# Patient Record
Sex: Female | Born: 1940 | Race: White | Hispanic: No | Marital: Married
Health system: Southern US, Community
[De-identification: ages and names within clinical notes are randomized; demographics above are authoritative.]

---

## 1999-01-24 ENCOUNTER — Other Ambulatory Visit: Admission: RE | Admit: 1999-01-24 | Discharge: 1999-01-24 | Payer: Self-pay | Admitting: Endocrinology

## 1999-10-13 ENCOUNTER — Ambulatory Visit (HOSPITAL_BASED_OUTPATIENT_CLINIC_OR_DEPARTMENT_OTHER): Admission: RE | Admit: 1999-10-13 | Discharge: 1999-10-13 | Payer: Self-pay | Admitting: Orthopedic Surgery

## 2000-06-11 ENCOUNTER — Other Ambulatory Visit: Admission: RE | Admit: 2000-06-11 | Discharge: 2000-06-11 | Payer: Self-pay | Admitting: Internal Medicine

## 2001-06-27 ENCOUNTER — Other Ambulatory Visit: Admission: RE | Admit: 2001-06-27 | Discharge: 2001-06-27 | Payer: Self-pay | Admitting: Endocrinology

## 2002-07-10 ENCOUNTER — Ambulatory Visit (HOSPITAL_COMMUNITY): Admission: RE | Admit: 2002-07-10 | Discharge: 2002-07-10 | Payer: Self-pay | Admitting: *Deleted

## 2002-07-10 ENCOUNTER — Encounter (INDEPENDENT_AMBULATORY_CARE_PROVIDER_SITE_OTHER): Payer: Self-pay | Admitting: *Deleted

## 2002-09-04 ENCOUNTER — Other Ambulatory Visit: Admission: RE | Admit: 2002-09-04 | Discharge: 2002-09-04 | Payer: Self-pay | Admitting: Endocrinology

## 2004-05-24 ENCOUNTER — Encounter: Admission: RE | Admit: 2004-05-24 | Discharge: 2004-05-24 | Payer: Self-pay | Admitting: Endocrinology

## 2004-10-24 ENCOUNTER — Other Ambulatory Visit: Admission: RE | Admit: 2004-10-24 | Discharge: 2004-10-24 | Payer: Self-pay | Admitting: Endocrinology

## 2005-11-28 ENCOUNTER — Other Ambulatory Visit: Admission: RE | Admit: 2005-11-28 | Discharge: 2005-11-28 | Payer: Self-pay | Admitting: Internal Medicine

## 2005-12-05 ENCOUNTER — Encounter: Admission: RE | Admit: 2005-12-05 | Discharge: 2005-12-05 | Payer: Self-pay | Admitting: Endocrinology

## 2006-12-11 ENCOUNTER — Other Ambulatory Visit: Admission: RE | Admit: 2006-12-11 | Discharge: 2006-12-11 | Payer: Self-pay | Admitting: Internal Medicine

## 2006-12-20 ENCOUNTER — Encounter: Admission: RE | Admit: 2006-12-20 | Discharge: 2006-12-20 | Payer: Self-pay | Admitting: Endocrinology

## 2007-01-15 ENCOUNTER — Ambulatory Visit: Payer: Self-pay | Admitting: Thoracic Surgery

## 2007-01-22 ENCOUNTER — Ambulatory Visit (HOSPITAL_COMMUNITY): Admission: RE | Admit: 2007-01-22 | Discharge: 2007-01-22 | Payer: Self-pay | Admitting: Thoracic Surgery

## 2007-01-23 ENCOUNTER — Ambulatory Visit: Payer: Self-pay | Admitting: Thoracic Surgery

## 2007-01-29 ENCOUNTER — Ambulatory Visit: Admission: RE | Admit: 2007-01-29 | Discharge: 2007-04-29 | Payer: Self-pay | Admitting: Radiation Oncology

## 2007-02-12 ENCOUNTER — Ambulatory Visit: Payer: Self-pay | Admitting: Thoracic Surgery

## 2007-02-12 ENCOUNTER — Inpatient Hospital Stay (HOSPITAL_COMMUNITY): Admission: RE | Admit: 2007-02-12 | Discharge: 2007-02-16 | Payer: Self-pay | Admitting: Thoracic Surgery

## 2007-02-12 ENCOUNTER — Encounter (INDEPENDENT_AMBULATORY_CARE_PROVIDER_SITE_OTHER): Payer: Self-pay | Admitting: Specialist

## 2007-02-15 ENCOUNTER — Ambulatory Visit: Payer: Self-pay | Admitting: Internal Medicine

## 2007-02-20 ENCOUNTER — Encounter: Admission: RE | Admit: 2007-02-20 | Discharge: 2007-02-20 | Payer: Self-pay | Admitting: Thoracic Surgery

## 2007-02-20 ENCOUNTER — Ambulatory Visit: Payer: Self-pay | Admitting: Thoracic Surgery

## 2007-03-13 ENCOUNTER — Encounter: Admission: RE | Admit: 2007-03-13 | Discharge: 2007-03-13 | Payer: Self-pay | Admitting: Thoracic Surgery

## 2007-03-13 ENCOUNTER — Ambulatory Visit: Payer: Self-pay | Admitting: Thoracic Surgery

## 2007-03-18 LAB — CBC WITH DIFFERENTIAL/PLATELET
Basophils Absolute: 0.1 10*3/uL (ref 0.0–0.1)
Eosinophils Absolute: 0 10*3/uL (ref 0.0–0.5)
HCT: 40.5 % (ref 34.8–46.6)
HGB: 14.2 g/dL (ref 11.6–15.9)
MONO#: 0.2 10*3/uL (ref 0.1–0.9)
NEUT%: 85.2 % — ABNORMAL HIGH (ref 39.6–76.8)
WBC: 9.3 10*3/uL (ref 3.9–10.0)
lymph#: 1.1 10*3/uL (ref 0.9–3.3)

## 2007-03-18 LAB — COMPREHENSIVE METABOLIC PANEL
ALT: 18 U/L (ref 0–35)
BUN: 11 mg/dL (ref 6–23)
CO2: 24 mEq/L (ref 19–32)
Calcium: 9.5 mg/dL (ref 8.4–10.5)
Chloride: 104 mEq/L (ref 96–112)
Creatinine, Ser: 0.88 mg/dL (ref 0.40–1.20)

## 2007-04-03 ENCOUNTER — Ambulatory Visit: Payer: Self-pay | Admitting: Thoracic Surgery

## 2007-04-03 ENCOUNTER — Encounter: Admission: RE | Admit: 2007-04-03 | Discharge: 2007-04-03 | Payer: Self-pay | Admitting: Thoracic Surgery

## 2007-05-30 ENCOUNTER — Ambulatory Visit (HOSPITAL_COMMUNITY): Admission: RE | Admit: 2007-05-30 | Discharge: 2007-05-30 | Payer: Self-pay | Admitting: Orthopedic Surgery

## 2007-08-05 ENCOUNTER — Ambulatory Visit (HOSPITAL_COMMUNITY): Admission: RE | Admit: 2007-08-05 | Discharge: 2007-08-05 | Payer: Self-pay | Admitting: *Deleted

## 2007-09-06 ENCOUNTER — Ambulatory Visit: Payer: Self-pay | Admitting: Internal Medicine

## 2007-09-10 LAB — COMPREHENSIVE METABOLIC PANEL
Albumin: 3.6 g/dL (ref 3.5–5.2)
Alkaline Phosphatase: 82 U/L (ref 39–117)
CO2: 28 mEq/L (ref 19–32)
Chloride: 104 mEq/L (ref 96–112)
Glucose, Bld: 110 mg/dL — ABNORMAL HIGH (ref 70–99)
Potassium: 4.1 mEq/L (ref 3.5–5.3)
Sodium: 140 mEq/L (ref 135–145)
Total Protein: 6.4 g/dL (ref 6.0–8.3)

## 2007-09-10 LAB — CBC WITH DIFFERENTIAL/PLATELET
BASO%: 0.5 % (ref 0.0–2.0)
HCT: 40.1 % (ref 34.8–46.6)
HGB: 14 g/dL (ref 11.6–15.9)
MCHC: 34.8 g/dL (ref 32.0–36.0)
MONO#: 0.5 10*3/uL (ref 0.1–0.9)
NEUT%: 63 % (ref 39.6–76.8)
WBC: 7 10*3/uL (ref 3.9–10.0)
lymph#: 1.8 10*3/uL (ref 0.9–3.3)

## 2007-09-12 ENCOUNTER — Ambulatory Visit (HOSPITAL_COMMUNITY): Admission: RE | Admit: 2007-09-12 | Discharge: 2007-09-12 | Payer: Self-pay | Admitting: Internal Medicine

## 2007-09-20 ENCOUNTER — Encounter: Admission: RE | Admit: 2007-09-20 | Discharge: 2007-09-20 | Payer: Self-pay | Admitting: Urology

## 2007-11-01 ENCOUNTER — Ambulatory Visit: Admission: RE | Admit: 2007-11-01 | Discharge: 2007-11-26 | Payer: Self-pay | Admitting: Radiation Oncology

## 2008-01-30 ENCOUNTER — Other Ambulatory Visit: Admission: RE | Admit: 2008-01-30 | Discharge: 2008-01-30 | Payer: Self-pay | Admitting: Internal Medicine

## 2008-03-11 ENCOUNTER — Ambulatory Visit (HOSPITAL_COMMUNITY): Admission: RE | Admit: 2008-03-11 | Discharge: 2008-03-11 | Payer: Self-pay | Admitting: Internal Medicine

## 2008-03-11 ENCOUNTER — Ambulatory Visit: Payer: Self-pay | Admitting: Internal Medicine

## 2008-03-11 LAB — CBC WITH DIFFERENTIAL/PLATELET
BASO%: 0.4 % (ref 0.0–2.0)
Eosinophils Absolute: 0.1 10*3/uL (ref 0.0–0.5)
MCHC: 34.4 g/dL (ref 32.0–36.0)
MCV: 89.8 fL (ref 81.0–101.0)
MONO%: 8.5 % (ref 0.0–13.0)
NEUT#: 3.9 10*3/uL (ref 1.5–6.5)
RBC: 4.65 10*6/uL (ref 3.70–5.32)
RDW: 14.2 % (ref 11.3–14.5)
WBC: 6.1 10*3/uL (ref 3.9–10.0)

## 2008-03-11 LAB — COMPREHENSIVE METABOLIC PANEL
ALT: 20 U/L (ref 0–35)
AST: 22 U/L (ref 0–37)
Albumin: 3.8 g/dL (ref 3.5–5.2)
Alkaline Phosphatase: 77 U/L (ref 39–117)
Calcium: 9.1 mg/dL (ref 8.4–10.5)
Glucose, Bld: 89 mg/dL (ref 70–99)
Potassium: 4 mEq/L (ref 3.5–5.3)
Sodium: 139 mEq/L (ref 135–145)
Total Bilirubin: 0.6 mg/dL (ref 0.3–1.2)
Total Protein: 6.7 g/dL (ref 6.0–8.3)

## 2008-09-04 ENCOUNTER — Ambulatory Visit: Payer: Self-pay | Admitting: Internal Medicine

## 2008-09-08 ENCOUNTER — Ambulatory Visit (HOSPITAL_COMMUNITY): Admission: RE | Admit: 2008-09-08 | Discharge: 2008-09-08 | Payer: Self-pay | Admitting: Internal Medicine

## 2008-09-08 LAB — COMPREHENSIVE METABOLIC PANEL
ALT: 19 U/L (ref 0–35)
AST: 21 U/L (ref 0–37)
BUN: 15 mg/dL (ref 6–23)
CO2: 29 mEq/L (ref 19–32)
Creatinine, Ser: 1 mg/dL (ref 0.40–1.20)
Total Bilirubin: 0.5 mg/dL (ref 0.3–1.2)

## 2008-09-08 LAB — CBC WITH DIFFERENTIAL/PLATELET
BASO%: 0.4 % (ref 0.0–2.0)
EOS%: 1.7 % (ref 0.0–7.0)
HCT: 44 % (ref 34.8–46.6)
LYMPH%: 25.8 % (ref 14.0–48.0)
MCH: 31.4 pg (ref 26.0–34.0)
MCHC: 34.3 g/dL (ref 32.0–36.0)
NEUT%: 64 % (ref 39.6–76.8)
Platelets: 311 10*3/uL (ref 145–400)

## 2008-09-17 ENCOUNTER — Ambulatory Visit (HOSPITAL_COMMUNITY): Admission: RE | Admit: 2008-09-17 | Discharge: 2008-09-17 | Payer: Self-pay | Admitting: Internal Medicine

## 2008-09-19 IMAGING — PT NM PET TUM IMG SKULL BASE T - THIGH
7 series · 25 of 25 positions shown · non-contrast
Comparison: CT chest from 12/20/2006

FDG PET-CT TUMOR IMAGING (SKULL BASE TO THIGHS):

Fasting Blood Glucose:  118

CLINICAL DATA: Left upper lobe pulmonary nodule.
TECHNIQUE: 16.4 mCi F-18 FDG was injected intravenously via the right hand . 
Full-ring PET imaging was performed from the skull base through the mid-thighs
54 minutes after injection.  CT data was obtained and used for attenuation
correction and anatomic localization only.  (This was not acquired as a
diagnostic CT examination.)

[Series 1: pet ac · axial · 3.3mm · 4.69mm/px · z∈[-870,+0]mm · 5 of 267 slices shown]
[im 1/267]
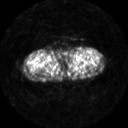
[im 67/267]
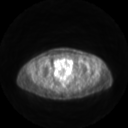
[im 134/267]
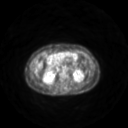
[im 200/267]
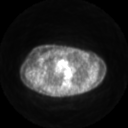
[im 267/267]
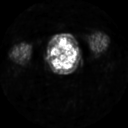

[Series 2: ct images · axial · 3.8mm · 0.98mm/px · z∈[-870,+0]mm · 5 of 265 slices shown]
[im 1/265]
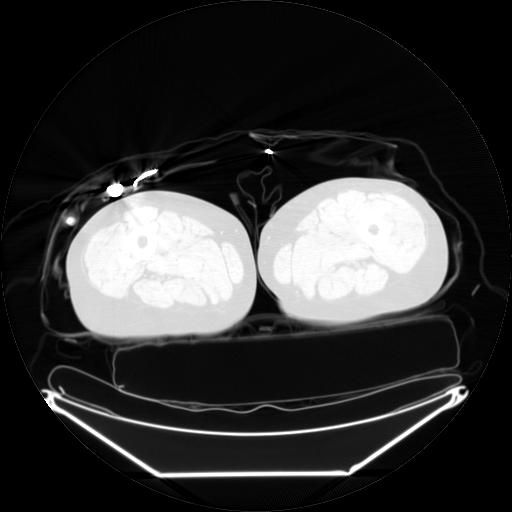
[im 67/265]
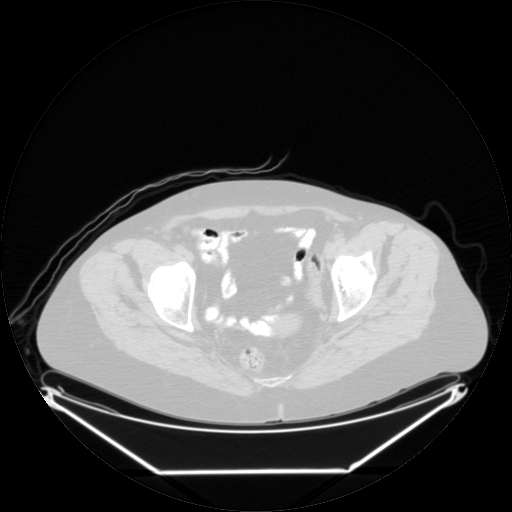
[im 133/265]
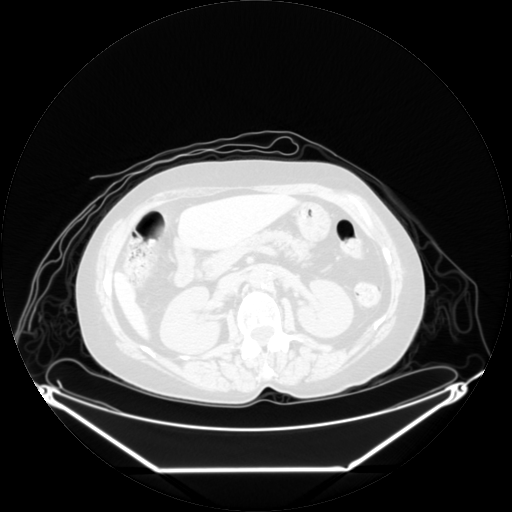
[im 199/265]
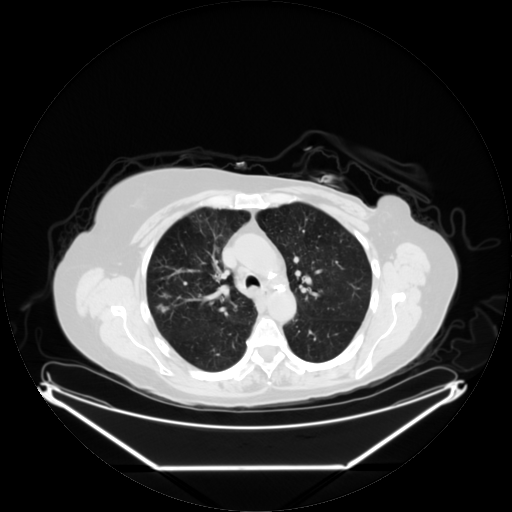
[im 265/265  brain]
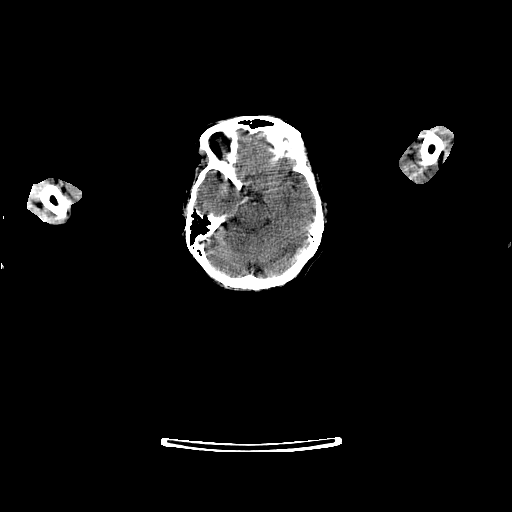

[Series 2: pet nac · axial · 3.3mm · 4.69mm/px · z∈[-870,+0]mm · 6 of 267 slices shown]
[im 1/267]
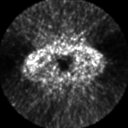
[im 54/267]
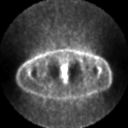
[im 107/267]
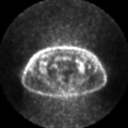
[im 160/267]
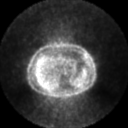
[im 213/267]
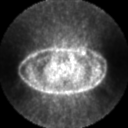
[im 267/267]
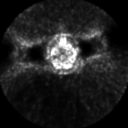

[Series 123: mip · coronal · 3.3mm · 4.69mm/px · 1 of 30 slices shown]
[im 1/30]
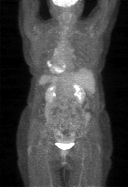

[Series 151: reformatted · axial · 3.3mm · 3.91mm/px · z∈[-870,+0]mm · 6 of 265 slices shown (1 of 3)]
[im 1/265]
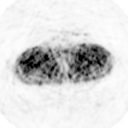
[im 53/265]
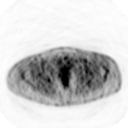
[im 106/265]
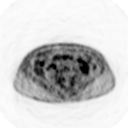
[im 159/265]
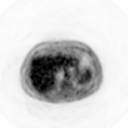
[im 212/265]
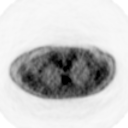
[im 265/265]
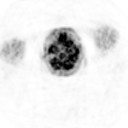

[Series 151: reformatted · axial · 3.3mm · 1.17mm/px · 1 of 3 slices shown (2 of 3)]
[im 1/3]
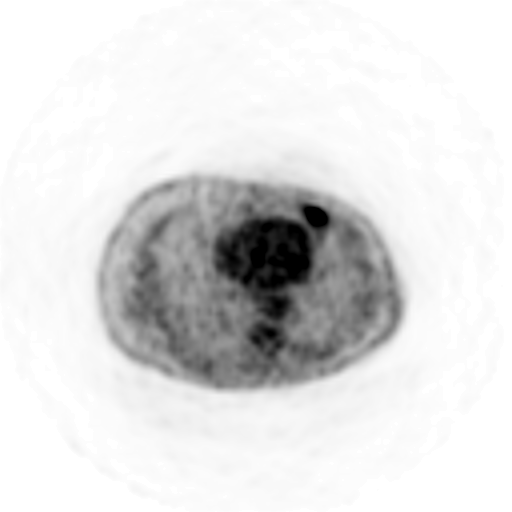

[Series 153: reformatted · coronal · 4.7mm · 6.98mm/px · 1 of 70 slices shown (3 of 3)]
[im 1/70]
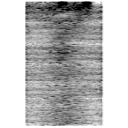

[25 of 25 positions shown; findings below may reference images not displayed]

FINDINGS: Three areas of abnormal FDG accumulation are identified in the chest. One of
these areas corresponds to the 11 mm left upper lobe pulmonary nodule. SUV max
for this lesion is 2.4. There is an intense focus of FDG uptake in the
anteromedial lingula, corresponding to a bilobed focus of airspace
consolidation. This is new in the one month interval since the previous chest CT
and although it is quite FDG avid, the rapid interval appearance suggests this
is most likely related to an infectious process. The third area of FDG uptake is
in the posterior right upper lobe in an area of alveolar opacity, almost having
a tree-in-bud appearance. See image 69 of the CT data. This is relatively low
level accumulation and is compatible with an infectious or inflammatory process.

No abnormal FDG accumulation is identified in the neck, abdomen, or pelvis.

The patient is status post left mastectomy. The left thyroid gland appears
absent. There is an exophytic nodule from the posterior right thyroid gland
which is not hypermetabolic on the PET scan.
IMPRESSION: The 11 mm left upper lobe pulmonary nodule is FDG avid. While the uptake is
borderline between infectious/inflammatory and neoplastic etiology, given the
small size of the nodule, the uptake is concerning for neoplastic involvement.

Probable lingular pneumonia given the rapid interval appearance. Repeat CT chest
after therapy could be used to ensure resolution.

A cluster of patchy alveolar opacities seen in the right upper lobe, also
probably postinfectious or inflammatory, given the rapid interval appearance
since the prior CT chest.

## 2008-09-29 ENCOUNTER — Encounter: Admission: RE | Admit: 2008-09-29 | Discharge: 2008-09-29 | Payer: Self-pay | Admitting: Endocrinology

## 2009-03-05 ENCOUNTER — Ambulatory Visit: Payer: Self-pay | Admitting: Internal Medicine

## 2009-03-09 LAB — CBC WITH DIFFERENTIAL/PLATELET
BASO%: 0.4 % (ref 0.0–2.0)
EOS%: 1.6 % (ref 0.0–7.0)
HCT: 40.3 % (ref 34.8–46.6)
LYMPH%: 24.6 % (ref 14.0–49.7)
MCH: 31.3 pg (ref 25.1–34.0)
MCHC: 34.2 g/dL (ref 31.5–36.0)
NEUT%: 65.3 % (ref 38.4–76.8)
lymph#: 1.6 10*3/uL (ref 0.9–3.3)

## 2009-03-09 LAB — COMPREHENSIVE METABOLIC PANEL
ALT: 23 U/L (ref 0–35)
AST: 27 U/L (ref 0–37)
Alkaline Phosphatase: 97 U/L (ref 39–117)
Chloride: 103 mEq/L (ref 96–112)
Creatinine, Ser: 0.8 mg/dL (ref 0.40–1.20)
Total Bilirubin: 0.4 mg/dL (ref 0.3–1.2)

## 2009-03-11 ENCOUNTER — Ambulatory Visit (HOSPITAL_COMMUNITY): Admission: RE | Admit: 2009-03-11 | Discharge: 2009-03-11 | Payer: Self-pay | Admitting: Internal Medicine

## 2009-03-17 ENCOUNTER — Ambulatory Visit: Payer: Self-pay | Admitting: Thoracic Surgery

## 2009-05-12 ENCOUNTER — Other Ambulatory Visit: Admission: RE | Admit: 2009-05-12 | Discharge: 2009-05-12 | Payer: Self-pay | Admitting: Internal Medicine

## 2009-09-08 ENCOUNTER — Ambulatory Visit: Payer: Self-pay | Admitting: Internal Medicine

## 2009-09-10 ENCOUNTER — Ambulatory Visit (HOSPITAL_COMMUNITY): Admission: RE | Admit: 2009-09-10 | Discharge: 2009-09-10 | Payer: Self-pay | Admitting: Internal Medicine

## 2009-09-10 LAB — COMPREHENSIVE METABOLIC PANEL
ALT: 17 U/L (ref 0–35)
AST: 20 U/L (ref 0–37)
Calcium: 9.5 mg/dL (ref 8.4–10.5)
Chloride: 100 mEq/L (ref 96–112)
Creatinine, Ser: 0.98 mg/dL (ref 0.40–1.20)
Potassium: 4.3 mEq/L (ref 3.5–5.3)
Sodium: 137 mEq/L (ref 135–145)

## 2009-09-10 LAB — CBC WITH DIFFERENTIAL/PLATELET
BASO%: 0.3 % (ref 0.0–2.0)
EOS%: 1.7 % (ref 0.0–7.0)
MCH: 31.4 pg (ref 25.1–34.0)
MCHC: 34.3 g/dL (ref 31.5–36.0)
NEUT%: 71.7 % (ref 38.4–76.8)
RBC: 4.74 10*6/uL (ref 3.70–5.45)
RDW: 13.6 % (ref 11.2–14.5)
lymph#: 1.5 10*3/uL (ref 0.9–3.3)

## 2009-09-15 ENCOUNTER — Ambulatory Visit: Payer: Self-pay | Admitting: Thoracic Surgery

## 2009-09-20 ENCOUNTER — Encounter: Payer: Self-pay | Admitting: Internal Medicine

## 2009-09-21 ENCOUNTER — Ambulatory Visit (HOSPITAL_COMMUNITY): Admission: RE | Admit: 2009-09-21 | Discharge: 2009-09-21 | Payer: Self-pay | Admitting: Thoracic Surgery

## 2009-09-21 ENCOUNTER — Ambulatory Visit: Payer: Self-pay | Admitting: Thoracic Surgery

## 2009-09-21 ENCOUNTER — Encounter: Payer: Self-pay | Admitting: Thoracic Surgery

## 2009-10-12 ENCOUNTER — Ambulatory Visit: Payer: Self-pay | Admitting: Internal Medicine

## 2009-12-05 ENCOUNTER — Inpatient Hospital Stay (HOSPITAL_COMMUNITY): Admission: EM | Admit: 2009-12-05 | Discharge: 2009-12-13 | Payer: Self-pay | Admitting: Emergency Medicine

## 2009-12-06 ENCOUNTER — Encounter (INDEPENDENT_AMBULATORY_CARE_PROVIDER_SITE_OTHER): Payer: Self-pay | Admitting: Internal Medicine

## 2009-12-06 ENCOUNTER — Ambulatory Visit: Payer: Self-pay | Admitting: Vascular Surgery

## 2009-12-09 ENCOUNTER — Encounter (INDEPENDENT_AMBULATORY_CARE_PROVIDER_SITE_OTHER): Payer: Self-pay | Admitting: Internal Medicine

## 2009-12-17 ENCOUNTER — Inpatient Hospital Stay (HOSPITAL_COMMUNITY): Admission: EM | Admit: 2009-12-17 | Discharge: 2009-12-19 | Payer: Self-pay | Admitting: Emergency Medicine

## 2009-12-20 ENCOUNTER — Ambulatory Visit: Payer: Self-pay | Admitting: Internal Medicine

## 2009-12-20 LAB — CBC WITH DIFFERENTIAL/PLATELET
Basophils Absolute: 0 10*3/uL (ref 0.0–0.1)
Eosinophils Absolute: 0 10*3/uL (ref 0.0–0.5)
HGB: 13.7 g/dL (ref 11.6–15.9)
MCV: 92.5 fL (ref 79.5–101.0)
MONO#: 0.5 10*3/uL (ref 0.1–0.9)
MONO%: 2.9 % (ref 0.0–14.0)
NEUT#: 15.6 10*3/uL — ABNORMAL HIGH (ref 1.5–6.5)
RDW: 14.6 % — ABNORMAL HIGH (ref 11.2–14.5)
lymph#: 1 10*3/uL (ref 0.9–3.3)

## 2009-12-20 LAB — COMPREHENSIVE METABOLIC PANEL
Albumin: 3.3 g/dL — ABNORMAL LOW (ref 3.5–5.2)
BUN: 30 mg/dL — ABNORMAL HIGH (ref 6–23)
CO2: 32 mEq/L (ref 19–32)
Calcium: 8.3 mg/dL — ABNORMAL LOW (ref 8.4–10.5)
Chloride: 89 mEq/L — ABNORMAL LOW (ref 96–112)
Glucose, Bld: 324 mg/dL — ABNORMAL HIGH (ref 70–99)
Potassium: 3.6 mEq/L (ref 3.5–5.3)

## 2009-12-24 ENCOUNTER — Ambulatory Visit (HOSPITAL_COMMUNITY): Admission: RE | Admit: 2009-12-24 | Discharge: 2009-12-24 | Payer: Self-pay | Admitting: Internal Medicine

## 2009-12-28 LAB — CBC WITH DIFFERENTIAL/PLATELET
BASO%: 0.1 % (ref 0.0–2.0)
HGB: 13.6 g/dL (ref 11.6–15.9)
LYMPH%: 4.4 % — ABNORMAL LOW (ref 14.0–49.7)
MCH: 31.2 pg (ref 25.1–34.0)
MCHC: 34.1 g/dL (ref 31.5–36.0)
MONO#: 0.4 10*3/uL (ref 0.1–0.9)
MONO%: 2.6 % (ref 0.0–14.0)
NEUT#: 13.4 10*3/uL — ABNORMAL HIGH (ref 1.5–6.5)
NEUT%: 92.8 % — ABNORMAL HIGH (ref 38.4–76.8)

## 2009-12-28 LAB — COMPREHENSIVE METABOLIC PANEL
ALT: 32 U/L (ref 0–35)
Alkaline Phosphatase: 102 U/L (ref 39–117)
CO2: 40 mEq/L — ABNORMAL HIGH (ref 19–32)
Creatinine, Ser: 0.86 mg/dL (ref 0.40–1.20)
Total Bilirubin: 0.3 mg/dL (ref 0.3–1.2)

## 2009-12-31 ENCOUNTER — Ambulatory Visit: Admission: RE | Admit: 2009-12-31 | Discharge: 2010-01-12 | Payer: Self-pay | Admitting: Radiation Oncology

## 2010-02-25 DEATH — deceased

## 2010-12-18 ENCOUNTER — Encounter: Payer: Self-pay | Admitting: Internal Medicine

## 2011-02-12 LAB — COMPREHENSIVE METABOLIC PANEL
ALT: 59 U/L — ABNORMAL HIGH (ref 0–35)
AST: 25 U/L (ref 0–37)
AST: 38 U/L — ABNORMAL HIGH (ref 0–37)
Albumin: 3.1 g/dL — ABNORMAL LOW (ref 3.5–5.2)
BUN: 18 mg/dL (ref 6–23)
CO2: 35 mEq/L — ABNORMAL HIGH (ref 19–32)
CO2: 38 mEq/L — ABNORMAL HIGH (ref 19–32)
Calcium: 8.8 mg/dL (ref 8.4–10.5)
Calcium: 8.8 mg/dL (ref 8.4–10.5)
Chloride: 87 mEq/L — ABNORMAL LOW (ref 96–112)
Creatinine, Ser: 0.76 mg/dL (ref 0.4–1.2)
GFR calc Af Amer: >60 mL/min (ref >60)
GFR calc Af Amer: >60 mL/min (ref >60)
GFR calc non Af Amer: 58 mL/min — ABNORMAL LOW (ref >60)
GFR calc non Af Amer: >60 mL/min (ref >60)
Sodium: 139 mEq/L (ref 135–145)
Total Bilirubin: 0.2 mg/dL — ABNORMAL LOW (ref 0.3–1.2)

## 2011-02-12 LAB — BLOOD GAS, ARTERIAL
Drawn by: 229971
O2 Content: 2 L/min
pCO2 arterial: 52.1 mmHg — ABNORMAL HIGH (ref 35.0–45.0)
pH, Arterial: 7.484 — ABNORMAL HIGH (ref 7.350–7.400)

## 2011-02-12 LAB — BASIC METABOLIC PANEL
BUN: 55 mg/dL — ABNORMAL HIGH (ref 6–23)
CO2: 37 mEq/L — ABNORMAL HIGH (ref 19–32)
CO2: 38 mEq/L — ABNORMAL HIGH (ref 19–32)
CO2: 39 mEq/L — ABNORMAL HIGH (ref 19–32)
CO2: 40 mEq/L — ABNORMAL HIGH (ref 19–32)
CO2: 41 mEq/L (ref 19–32)
Calcium: 8.8 mg/dL (ref 8.4–10.5)
Chloride: 90 mEq/L — ABNORMAL LOW (ref 96–112)
Chloride: 91 mEq/L — ABNORMAL LOW (ref 96–112)
Chloride: 95 mEq/L — ABNORMAL LOW (ref 96–112)
Chloride: 73 mEq/L — ABNORMAL LOW (ref 96–112)
Chloride: 85 mEq/L — ABNORMAL LOW (ref 96–112)
Chloride: 90 mEq/L — ABNORMAL LOW (ref 96–112)
Chloride: 91 mEq/L — ABNORMAL LOW (ref 96–112)
Chloride: 92 mEq/L — ABNORMAL LOW (ref 96–112)
Creatinine, Ser: 0.78 mg/dL (ref 0.4–1.2)
Creatinine, Ser: 0.87 mg/dL (ref 0.4–1.2)
Creatinine, Ser: 1 mg/dL (ref 0.4–1.2)
GFR calc Af Amer: >60 mL/min (ref >60)
GFR calc Af Amer: >60 mL/min (ref >60)
GFR calc Af Amer: >60 mL/min (ref >60)
GFR calc Af Amer: >60 mL/min (ref >60)
GFR calc Af Amer: >60 mL/min (ref >60)
GFR calc Af Amer: >60 mL/min (ref >60)
GFR calc non Af Amer: >60 mL/min (ref >60)
GFR calc non Af Amer: >60 mL/min (ref >60)
GFR calc non Af Amer: >60 mL/min (ref >60)
GFR calc non Af Amer: >60 mL/min (ref >60)
Glucose, Bld: 142 mg/dL — ABNORMAL HIGH (ref 70–99)
Glucose, Bld: 170 mg/dL — ABNORMAL HIGH (ref 70–99)
Glucose, Bld: 262 mg/dL — ABNORMAL HIGH (ref 70–99)
Glucose, Bld: 339 mg/dL — ABNORMAL HIGH (ref 70–99)
Potassium: 2.9 mEq/L — ABNORMAL LOW (ref 3.5–5.1)
Potassium: 3.4 mEq/L — ABNORMAL LOW (ref 3.5–5.1)
Potassium: 4.2 mEq/L (ref 3.5–5.1)
Potassium: 4.2 mEq/L (ref 3.5–5.1)
Potassium: 2.8 mEq/L — ABNORMAL LOW (ref 3.5–5.1)
Potassium: 3 mEq/L — ABNORMAL LOW (ref 3.5–5.1)
Potassium: 3.4 mEq/L — ABNORMAL LOW (ref 3.5–5.1)
Potassium: 3.6 mEq/L (ref 3.5–5.1)
Potassium: <2 mEq/L — CL (ref 3.5–5.1)
Sodium: 135 mEq/L (ref 135–145)
Sodium: 138 mEq/L (ref 135–145)
Sodium: 138 mEq/L (ref 135–145)
Sodium: 140 mEq/L (ref 135–145)
Sodium: 138 mEq/L (ref 135–145)
Sodium: 139 mEq/L (ref 135–145)
Sodium: 139 mEq/L (ref 135–145)
Sodium: 140 mEq/L (ref 135–145)

## 2011-02-12 LAB — CBC
HCT: 41.7 % (ref 36.0–46.0)
HCT: 42.4 % (ref 36.0–46.0)
HCT: 42.5 % (ref 36.0–46.0)
HCT: 42.5 % (ref 36.0–46.0)
HCT: 37.4 % (ref 36.0–46.0)
HCT: 46.5 % — ABNORMAL HIGH (ref 36.0–46.0)
HCT: 47.4 % — ABNORMAL HIGH (ref 36.0–46.0)
HCT: 47.6 % — ABNORMAL HIGH (ref 36.0–46.0)
Hemoglobin: 13.7 g/dL (ref 12.0–15.0)
Hemoglobin: 14.1 g/dL (ref 12.0–15.0)
Hemoglobin: 14.2 g/dL (ref 12.0–15.0)
Hemoglobin: 14.3 g/dL (ref 12.0–15.0)
Hemoglobin: 16.5 g/dL — ABNORMAL HIGH (ref 12.0–15.0)
Hemoglobin: 12.8 g/dL (ref 12.0–15.0)
Hemoglobin: 15.6 g/dL — ABNORMAL HIGH (ref 12.0–15.0)
Hemoglobin: 15.6 g/dL — ABNORMAL HIGH (ref 12.0–15.0)
Hemoglobin: 16.3 g/dL — ABNORMAL HIGH (ref 12.0–15.0)
MCHC: 33 g/dL (ref 30.0–36.0)
MCHC: 33.2 g/dL (ref 30.0–36.0)
MCHC: 33.3 g/dL (ref 30.0–36.0)
MCHC: 33.4 g/dL (ref 30.0–36.0)
MCHC: 32.9 g/dL (ref 30.0–36.0)
MCV: 92.4 fL (ref 78.0–100.0)
MCV: 92.6 fL (ref 78.0–100.0)
MCV: 92.8 fL (ref 78.0–100.0)
MCV: 92.9 fL (ref 78.0–100.0)
MCV: 91.1 fL (ref 78.0–100.0)
MCV: 92.5 fL (ref 78.0–100.0)
MCV: 93.1 fL (ref 78.0–100.0)
Platelets: 218 10*3/uL (ref 150–400)
Platelets: 248 10*3/uL (ref 150–400)
RBC: 4.46 MIL/uL (ref 3.87–5.11)
RBC: 4.58 MIL/uL (ref 3.87–5.11)
RBC: 4.59 MIL/uL (ref 3.87–5.11)
RBC: 4.59 MIL/uL (ref 3.87–5.11)
RBC: 4.89 MIL/uL (ref 3.87–5.11)
RBC: 5.36 MIL/uL — ABNORMAL HIGH (ref 3.87–5.11)
RBC: 4.05 MIL/uL (ref 3.87–5.11)
RBC: 5.23 MIL/uL — ABNORMAL HIGH (ref 3.87–5.11)
RDW: 15.7 % — ABNORMAL HIGH (ref 11.5–15.5)
RDW: 14.6 % (ref 11.5–15.5)
WBC: 10.6 10*3/uL — ABNORMAL HIGH (ref 4.0–10.5)
WBC: 11.1 10*3/uL — ABNORMAL HIGH (ref 4.0–10.5)
WBC: 15.5 10*3/uL — ABNORMAL HIGH (ref 4.0–10.5)
WBC: 17.8 10*3/uL — ABNORMAL HIGH (ref 4.0–10.5)
WBC: 14.1 10*3/uL — ABNORMAL HIGH (ref 4.0–10.5)
WBC: 18.3 10*3/uL — ABNORMAL HIGH (ref 4.0–10.5)
WBC: 26.8 10*3/uL — ABNORMAL HIGH (ref 4.0–10.5)

## 2011-02-12 LAB — DIFFERENTIAL
Basophils Absolute: 0 K/uL (ref 0.0–0.1)
Basophils Relative: 0 % (ref 0–1)
Basophils Relative: 1 % (ref 0–1)
Eosinophils Absolute: 0 10*3/uL (ref 0.0–0.7)
Eosinophils Absolute: 0 K/uL (ref 0.0–0.7)
Eosinophils Relative: 0 % (ref 0–5)
Eosinophils Relative: 0 % (ref 0–5)
Eosinophils Relative: 0 % (ref 0–5)
Lymphocytes Relative: 7 % — ABNORMAL LOW (ref 12–46)
Lymphs Abs: 0.6 10*3/uL — ABNORMAL LOW (ref 0.7–4.0)
Lymphs Abs: 1 K/uL (ref 0.7–4.0)
Lymphs Abs: 0.8 10*3/uL (ref 0.7–4.0)
Monocytes Absolute: 0.8 10*3/uL (ref 0.1–1.0)
Monocytes Absolute: 0.8 K/uL (ref 0.1–1.0)
Monocytes Absolute: 0.3 10*3/uL (ref 0.1–1.0)
Monocytes Relative: 6 % (ref 3–12)
Neutro Abs: 12.4 K/uL — ABNORMAL HIGH (ref 1.7–7.7)
Neutro Abs: 25.4 10*3/uL — ABNORMAL HIGH (ref 1.7–7.7)
Neutrophils Relative %: 87 % — ABNORMAL HIGH (ref 43–77)

## 2011-02-12 LAB — COMPREHENSIVE METABOLIC PANEL WITH GFR
ALT: 58 U/L — ABNORMAL HIGH (ref 0–35)
AST: 54 U/L — ABNORMAL HIGH (ref 0–37)
Albumin: 3.4 g/dL — ABNORMAL LOW (ref 3.5–5.2)
Alkaline Phosphatase: 81 U/L (ref 39–117)
BUN: 16 mg/dL (ref 6–23)
CO2: 40 meq/L — ABNORMAL HIGH (ref 19–32)
Calcium: 8.8 mg/dL (ref 8.4–10.5)
Chloride: 89 meq/L — ABNORMAL LOW (ref 96–112)
Creatinine, Ser: 0.93 mg/dL (ref 0.4–1.2)
GFR calc non Af Amer: 60 mL/min — ABNORMAL LOW
Glucose, Bld: 101 mg/dL — ABNORMAL HIGH (ref 70–99)
Potassium: 2.1 meq/L — CL (ref 3.5–5.1)
Sodium: 140 meq/L (ref 135–145)
Total Bilirubin: 0.5 mg/dL (ref 0.3–1.2)
Total Protein: 6.1 g/dL (ref 6.0–8.3)

## 2011-02-12 LAB — CARDIAC PANEL(CRET KIN+CKTOT+MB+TROPI)
Troponin I: 0.05 ng/mL (ref 0.00–0.06)
Troponin I: 0.06 ng/mL (ref 0.00–0.06)

## 2011-02-12 LAB — CULTURE, BLOOD (ROUTINE X 2): Culture: NO GROWTH

## 2011-02-12 LAB — URINE MICROSCOPIC-ADD ON

## 2011-02-12 LAB — BRAIN NATRIURETIC PEPTIDE
Pro B Natriuretic peptide (BNP): 239 pg/mL — ABNORMAL HIGH (ref 0.0–100.0)
Pro B Natriuretic peptide (BNP): 332 pg/mL — ABNORMAL HIGH (ref 0.0–100.0)

## 2011-02-12 LAB — CK TOTAL AND CKMB (NOT AT ARMC)
CK, MB: 6.4 ng/mL (ref 0.3–4.0)
Total CK: 84 U/L (ref 7–177)

## 2011-02-12 LAB — URINALYSIS, ROUTINE W REFLEX MICROSCOPIC
Bilirubin Urine: NEGATIVE
Glucose, UA: NEGATIVE mg/dL
Hgb urine dipstick: NEGATIVE
Hgb urine dipstick: NEGATIVE
Ketones, ur: NEGATIVE mg/dL
Nitrite: NEGATIVE
Nitrite: NEGATIVE
Protein, ur: NEGATIVE mg/dL
Specific Gravity, Urine: 1.007 (ref 1.005–1.030)
Specific Gravity, Urine: 1.014 (ref 1.005–1.030)
Urobilinogen, UA: 0.2 mg/dL (ref 0.0–1.0)
Urobilinogen, UA: 0.2 mg/dL (ref 0.0–1.0)
pH: 7.5 (ref 5.0–8.0)
pH: 6.5 (ref 5.0–8.0)

## 2011-02-12 LAB — CREATININE, URINE, RANDOM: Creatinine, Urine: 42.5 mg/dL

## 2011-02-12 LAB — GLUCOSE, CAPILLARY
Glucose-Capillary: 223 mg/dL — ABNORMAL HIGH (ref 70–99)
Glucose-Capillary: 242 mg/dL — ABNORMAL HIGH (ref 70–99)
Glucose-Capillary: 266 mg/dL — ABNORMAL HIGH (ref 70–99)

## 2011-02-12 LAB — BASIC METABOLIC PANEL WITH GFR
BUN: 15 mg/dL (ref 6–23)
CO2: 33 meq/L — ABNORMAL HIGH (ref 19–32)
Calcium: 8.4 mg/dL (ref 8.4–10.5)
Chloride: 89 meq/L — ABNORMAL LOW (ref 96–112)
Creatinine, Ser: 0.97 mg/dL (ref 0.4–1.2)
GFR calc non Af Amer: 57 mL/min — ABNORMAL LOW
Glucose, Bld: 182 mg/dL — ABNORMAL HIGH (ref 70–99)
Potassium: 2.6 meq/L — CL (ref 3.5–5.1)
Sodium: 138 meq/L (ref 135–145)

## 2011-02-12 LAB — SODIUM, URINE, RANDOM: Sodium, Ur: <10 mEq/L

## 2011-02-12 LAB — TROPONIN I

## 2011-02-12 LAB — MAGNESIUM
Magnesium: 1.6 mg/dL (ref 1.5–2.5)
Magnesium: 2.2 mg/dL (ref 1.5–2.5)

## 2011-02-12 LAB — NA AND K (SODIUM & POTASSIUM), RAND UR
Potassium Urine: 59 mEq/L
Sodium, Ur: 56 mEq/L

## 2011-02-12 LAB — LIPID PANEL
HDL: 65 mg/dL (ref >39)
Total CHOL/HDL Ratio: 2.4 RATIO

## 2011-02-12 LAB — TSH: TSH: 2.138 u[IU]/mL (ref 0.350–4.500)

## 2011-02-12 LAB — OSMOLALITY, URINE: Osmolality, Ur: 424 mOsm/kg (ref 390–1090)

## 2011-02-12 LAB — PROTIME-INR
INR: 0.92 (ref 0.00–1.49)
Prothrombin Time: 12.3 seconds (ref 11.6–15.2)

## 2011-02-12 LAB — EXPECTORATED SPUTUM ASSESSMENT W GRAM STAIN, RFLX TO RESP C

## 2011-02-12 LAB — PREALBUMIN: Prealbumin: 27.6 mg/dL (ref 18.0–45.0)

## 2011-02-12 LAB — HEMOGLOBIN A1C: Hgb A1c MFr Bld: 6.8 % — ABNORMAL HIGH (ref 4.6–6.1)

## 2011-02-12 LAB — URINE CULTURE

## 2011-02-12 LAB — CULTURE, RESPIRATORY W GRAM STAIN: Culture: NORMAL

## 2011-02-12 LAB — T4, FREE: Free T4: 2.34 ng/dL — ABNORMAL HIGH (ref 0.80–1.80)

## 2011-02-12 LAB — FRACTIONAL EXCRET-NA(24HR UR +SERUM): Creatinine, Ser: 0.94 mg/dL (ref 0.40–1.20)

## 2011-03-02 LAB — APTT: aPTT: 24 seconds (ref 24–37)

## 2011-03-02 LAB — CBC
MCHC: 34.1 g/dL (ref 30.0–36.0)
MCV: 93.1 fL (ref 78.0–100.0)
Platelets: 283 10*3/uL (ref 150–400)
RDW: 14.1 % (ref 11.5–15.5)

## 2011-03-02 LAB — AFB CULTURE WITH SMEAR (NOT AT ARMC): Acid Fast Smear: NONE SEEN

## 2011-03-02 LAB — CULTURE, RESPIRATORY W GRAM STAIN

## 2011-03-02 LAB — COMPREHENSIVE METABOLIC PANEL
AST: 23 U/L (ref 0–37)
Albumin: 4 g/dL (ref 3.5–5.2)
CO2: 28 mEq/L (ref 19–32)
Calcium: 9 mg/dL (ref 8.4–10.5)
Creatinine, Ser: 0.81 mg/dL (ref 0.4–1.2)
GFR calc Af Amer: >60 mL/min (ref >60)
GFR calc non Af Amer: >60 mL/min (ref >60)

## 2011-03-02 LAB — FUNGUS CULTURE W SMEAR

## 2011-04-11 NOTE — Letter (Signed)
March 17, 2009   Lajuana Matte, MD  [phone]  [place], [place] [address]   Re:  THEKLA, COLBORN                 DOB:  05/30/1941   Dear Arbutus Ped:   The patient came for followup today.  Her blood pressure was 170/98,  pulse 90, respirations 18, and sats were 92%.  She had a CT scan  recently for followup of her seed implantation, it showed a small  loculated area in the area of the seed implantation, but no evidence of  recurrence of her cancer.  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  I am really not that concerned about this.  This is something we sometimes do see several months or years after the  seed implantation and other than getting another CT scan in 6 months,  that is all that I would do.  I did talk to her today about this and  said that I will follow up on her in 6 months.   Sincerely,   Ines Bloomer, M.D.  Electronically Signed   DPB/MEDQ  D:  03/17/2009  T:  03/18/2009  Job:  [id number]

## 2011-04-11 NOTE — Op Note (Signed)
[name], Karen Strong                 ACCOUNT NO.:  [id number]   MEDICAL RECORD NO.:  [id number]          PATIENT TYPE:  AMB   LOCATION:  ENDO                         FACILITY:  [facility]   PHYSICIAN:  Georgiana Spinner, M.D.    DATE OF BIRTH:  [birth year]   DATE OF PROCEDURE:  08/05/2007  DATE OF DISCHARGE:                               OPERATIVE REPORT   PROCEDURE:  Colonoscopy.   INDICATIONS:  Colon cancer screening.   ANESTHESIA:  Fentanyl 100 mcg, Versed 8 mg.   PROCEDURE:  With the patient mildly sedated in the left lateral  decubitus position, the Pentax videoscopic colonoscope was inserted in  the rectum and passed under direct vision to the cecum identified by  ileocecal valve and base of cecum, both of which were photographed.  From this point the colonoscope was slowly withdrawn, taking  circumferential views of the colonic mucosa, stopping only in the rectum  which appeared normal on direct and retroflexed view.  The colonoscope  was straightened and withdrawn.  The patient's vital signs and pulse  oximeter remained stable.  The patient tolerated procedure well without  apparent complications.   FINDINGS:  Negative examination.   PLAN:  Repeat in 5-10 years           ______________________________  Georgiana Spinner, M.D.     GMO/MEDQ  D:  08/05/2007  T:  08/05/2007  Job:  [id number]

## 2011-04-11 NOTE — Letter (Signed)
September 15, 2009   Lajuana Matte, MD  [phone]  [place], [place] [address]   Re:  Karen Strong, Karen Strong                 DOB:  19-Jun-1941   Dear Arbutus Ped,   I saw the patient back today.  She is now 2 years since her surgery, but  her CT scan shows mediastinal AP window adenopathy.  She has had some  cough.  I am concerned that she has recurrence of her cancer.  I have  discussed this with her with bronchoscopy and EBUS evaluation on September 15, 2009, and she will be having a PET scan on September 20, 2009.  I  appreciate the opportunity of seeing the patient.   Ines Bloomer, M.D.  Electronically Signed   DPB/MEDQ  D:  09/15/2009  T:  09/15/2009  Job:  [id number]

## 2011-04-14 NOTE — Op Note (Signed)
   Karen Strong, Karen Strong                          ACCOUNT NO.:  [id number]   MEDICAL RECORD NO.:  [id number]                   PATIENT TYPE:  AMB   LOCATION:  ENDO                                 FACILITY:  [facility]   PHYSICIAN:  Georgiana Spinner, M.D.                 DATE OF BIRTH:  09-11-1941   DATE OF PROCEDURE:  DATE OF DISCHARGE:                                 OPERATIVE REPORT   PROCEDURE:  Upper endoscopy with biopsy.   INDICATIONS FOR PROCEDURE:  Gastroesophageal reflux disease.   ANESTHESIA:  Demerol 50, Versed 5 mg.   DESCRIPTION OF PROCEDURE:  With the patient mildly sedated in the left  lateral decubitus position, the Olympus videoscopic endoscope was inserted  in the mouth and passed under direct vision through the esophagus which  appeared normal into the stomach, fundus, body, antrum, duodenal bulb and  second portion of the duodenum. From this point, the endoscope was slowly  withdrawn taking circumferential views of the entire duodenal mucosa until  the endoscope was then pulled back into the stomach and placed in  retroflexion to view the stomach from below  and this too appeared normal.  The endoscope was then straightened and withdrawn taking circumferential  views of the remaining gastric and esophageal mucosa stopping in the body of  the stomach where there was a question of some erythema seen and biopsied.  The patient's vital signs and pulse oximeter remained stable. The patient  tolerated the procedure well without apparent complications.   FINDINGS:  Question of mild erythema biopsied. Await biopsy report. The  patient will call me for results and followup with me in as an outpatient.                                                Georgiana Spinner, M.D.    GMO/MEDQ  D:  07/10/2002  T:  07/12/2002  Job:  [id number] [id number]

## 2011-04-14 NOTE — Op Note (Signed)
Karen Strong, Karen Strong                 ACCOUNT NO.:  [id number]   MEDICAL RECORD NO.:  [id number]          PATIENT TYPE:  INP   LOCATION:  2550                         FACILITY:  [facility]   PHYSICIAN:  Ines Bloomer, M.D. DATE OF BIRTH:  [birth year]   DATE OF PROCEDURE:  02/12/2007  DATE OF DISCHARGE:                               OPERATIVE REPORT   PREOPERATIVE DIAGNOSIS:  Left upper lobe nodule.   POSTOPERATIVE DIAGNOSIS:  Non-small cell lung cancer, left upper lobe.   OPERATION PERFORMED:  Left video-assisted thoracoscopic surgery, wedge  resection of left upper lobe mass with seed implantation, and node  sampling and mini-thoracotomy.   SURGEON:  Ines Bloomer, M.D.   FIRST ASSISTANT:  Rowe Clack, P.A.-C.   General anesthesia.   After percutaneous insertion of all monitoring lines, she underwent  general anesthesia.  She was prepped and draped in the usual sterile  manner.  Turned to the left lateral thoracotomy position.  Two trocar  sites were made in the anterior and posterior axillary line at the  seventh intercostal space.  Two trocars were inserted and a 6-cm  incision was made over the fifth intercostal space anteriorly, splitting  the serratus and entering the fifth intercostal space.  A small  retractor was inserted with minimal rib spreading. the lesion was in the  base of the lingula of the left upper lobe.  There were some adhesions  there that had be taken down with electrocautery.  Then the lesion was  grasped with a Davol lung clamp and then resected with the Autosuture  suture 30 white Roticulator, oversewing several of the suture lines with  3-0 Vicryl.  Dermabond was applied to the suture line.  The area where  the cancer was close to the staple line was marked with a coronary ring  and this was sutured place with a 4-0 Prolene.  Frozen section revealed  non-small cell lung cancer.  While the frozen section was being done,  some 11R nodes in the fissure  were removed and biopsied and then a 10R  node and a 5 node in the hilum was removed and biopsied.  No other nodes  were seen.  A Marcaine block was done in the usual fashion and the On-Q  catheter was inserted.  A single On-Q catheter was inserted in the usual  fashion.  The frozen section, as mentioned, came back non-small cell  lung cancer so with Dr. Molli Hazard Manning's help, we applied a mesh with  seeds and sutured that in place with 3-0 Vicryl and also using the  Prolene sutures at the area of where the cancer was closest to the  staple line.  After the mesh had been sutured in place with 3-0 Vicryl,  lung was expanded and it expanded well.  There was no air leak seen.  Two chest tubes were brought in through the trocar sites and tied in  place with 0silk.  The chest was closed with two pericostal, #1 Vicryl  in the muscle layer, and 2-0 Vicryl in the subcutaneous tissue and 3-0  Vicryl  as a subcuticular stitch, Dermabond for the skin.  The patient  returned to the recovery room in stable condition     Ines Bloomer, M.D.  Electronically Signed    DPB/MEDQ  D:  02/12/2007  T:  02/12/2007  Job:  [id number]

## 2011-04-14 NOTE — Op Note (Signed)
[facility]  Patient:    Karen Strong                         MRN: [id number] Proc. Date: 10/13/99 Adm. Date:  [id number] Attending:  Drema Pry CC:         Karen Strong. Karen Strong, M.D.             Karen Strong, M.D.                           Operative Report  PREOPERATIVE DIAGNOSIS:  Right knee degenerative lateral and medial meniscus tears.  POSTOPERATIVE DIAGNOSIS: 1. Complete lateral meniscus tear, degenerative, with anterior horn flap    displaceable. 2. Lateral anterior cruciate ligament tear with remaining anterior cruciate    ligament intact. 3. 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 4. Peripatellar synovitis.  OPERATION: 1. Right knee operative arthroscopy with lateral meniscectomy. 2. Shaving anterior cruciate ligament. 3. Shaving posterior patella with ablation. 4. Parapatellar synovectomy. 5. Lateral retinacular release arthroscopically.  SURGEON:  Karen Strong, M.D.  ASSISTANT:  Karen Strong. Karen Strong, P.A.-C.  ANESTHESIA:  General with LMA.  CULTURES:  None.  DRAINS:  None.  ESTIMATED BLOOD LOSS:  Minimal.  TOURNIQUET TIME:  Without.  PATHOLOGIC FINDINGS/HISTORY:  Karen Strong is a 70 year old female whose right knee has been bothering her for about two months.  She twisted her knee and the knee popped. She went in to see Dr. Dellis Strong. Heller.  X-rays were taken.  There were some mild degenerative changes, patellofemoral.  We examined her.  She was 2-4 patellofemoral crepitation, placed into about 110, uncomfortable medially and laterally on McMurrays.  X-rays on the von Pollyann Kennedy view showed a narrowing in the left lateral  compartment.  The right knee von Pollyann Kennedy was not particularly narrowed.  I thought she had a degenerative medial and lateral meniscus tear of the right knee.  She may have had a popliteal cyst which ruptured and was resolving.  I injected  the joint with a 3:12 cortisone/Marcaine mixture and put her on anti-inflammatories.  She  came back in, had some initial relief with the injection, but the pain recurred, with a catching, locking, and giving way.  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  She desired to proceed, so that she would possibly be p to speed by the time the full-blown Christmas season was here.  At surgery she did indeed have a marked degenerative lateral meniscus tear with  anterior flap displaced in the joint, and a lateral ACL tear which was frayed. The remaining ACL, the medial two-thirds, was intact to stressing, and under direct  visualization to probing.  The medial meniscus was intact.  There were minor degenerative changes in the trochlea, both femoral condyles.  The posterior patella had grade 3-4 degenerative changes throughout, a very tight lateral retinaculum. Therefore we did a lateral subtotal meniscectomy back to a peripheral rim.  We shaved the lateral ACL.  I shaved and ablated the posterior patella smooth, and did an arthroscopic lateral retinacular release from the vastus lateralis to the joint line.  Parapatellar synovitis was shaved out, and light shaving carried out on he medial femoral condyle where there was a grade 1-2 defect.  DESCRIPTION OF PROCEDURE:  With adequate anesthesia obtained using the LMA technique, 1 g of Ancef  was given IV prophylaxis.  The patient was placed in the supine position.  The right lower extremity was prepped from the malleoli to the leg holder in the standard fashion.  After a standard prepping and draping, a superior lateral inflow portal was made.  The knee was insufflated with normal saline with the arthroscopic pump.  The medial and lateral scope portals were then made and the joint was thoroughly inspected.  I then probed the medial meniscus, lightly shaved the inner rim.  I then shaved the lateral ACL, and probed  the ACL. I then shaved out with basket and shaver, the lateral meniscus to a stable rim rom front to back, including the very unstable flipped-in anterior horn.  I then shaved the posterior patella, reversed portal, shaved and ablated the posterior patella smooth with light ablation on level 2 ArthroCare, and then did an arthroscopic lateral retinacular release from the vastus lateralis to the joint line, doing he ArthroCare probe.  Good tracking was noted afterward, and some additional parapatellar synovitis was shaved, and hemostasis was obtained.  When I was satisfied with the resections, the knee was irrigated with the scope, and 0.5% Marcaine was injected in and about the portals.  The portals were left open.  A  bulky sterile compressive dressing was applied, with a lateral foam pad for tamponade, and the ezy wrap placed.  The patient then, having tolerated the procedure well, was taken to the recovery room in satisfactory condition, to be given Tylox for pain, crutches, weightbearing as tolerated on ezy wrap.  Told to call the office for an appointment for recheck on Monday. DD:  10/13/99 TD:  10/14/99 Job: [id number]

## 2011-04-14 NOTE — H&P (Signed)
Karen Strong, Karen Strong                 ACCOUNT NO.:  [id number]   MEDICAL RECORD NO.:  [id number]          PATIENT TYPE:  INP   LOCATION:  NA                           FACILITY:  [facility]   PHYSICIAN:  Ines Bloomer, M.D. DATE OF BIRTH:  [birth year]   DATE OF ADMISSION:  DATE OF DISCHARGE:                              HISTORY & PHYSICAL   CHIEF COMPLAINT:  Lung nodules.   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  Smokes up two packs a day.  She had pulmonary function tests showing an FVC of 1.46 and FEV-1 of  1.09.  because of her cough she had a chest x-ray and then a CT scan  that showed am 11 mm nodule in the left upper lobe, which was thought to  be cancer.  She underwent a PET scan, which was positive with an SUV of  2.4 the left upper lobe and a questionable area of inflammation of the  left lingula and some patchy areas in the right upper lobe that was also  thought to be inflammatory.  She was referred Dr. Kathrynn Running for evaluation  and is being admitted for wedge resection of the left upper lobe lesion  with seed implantation.  She is also cautioned to stop smoking.   PAST MEDICAL HISTORY:  1. Chronic obstructive pulmonary disease.  2. Hypercholesterolemia.  3. Hypothyroidism.  4. Arthritis.   She takes:  1. Synthroid 100 mcg a day.  2. Arthrotec 75 mcg a day.  3. Cozaar 100 mg a day.  4. Robaxin p.r.n.  5. Dyazide p.r.n.  6. Lescol XL one daily.  7. Hydrocodone p.r.n. for arthritic pain.   FAMILY HISTORY:  Noncontributory.   SOCIAL HISTORY:  She is married.  Smokes 1-1/2 to 2 packs a day.  Does  not drink alcohol on a regular basis.   REVIEW OF SYSTEMS:  She is 158 pounds, she is 5 feet 9 inches.  CARDIAC:  No angina or atrial fibrillation.  PULMONARY:  She has bronchitis and  wheezing.  GI:  She has reflux and constipation.  GU:  She has no kidney  disease, dysuria or frequent urination.   VASCULAR:  No claudication, DVT  or TIAs.  NEUROLOGICAL:  No headaches, blackouts or seizures.  MUSCULOSKELETAL:  Arthritis and joint pain.  PSYCHIATRIC:  She has been  treated situational depression.  EYE/ENT:  No change in her eyesight or  hearing.  IMMUNOLOGIC:  No problems with bleeding or __________  disorders.   PHYSICAL EXAMINATION:  GENERAL:  She is a well-developed Caucasian  female in no acute distress.  VITAL SIGNS:  Blood pressure IS 160/88, pulse 85, respirations 18,  saturations were 95%.  HEENT:  Head is atraumatic.  Eyes:  Pupils equal, reactive to light and  accommodation.  Extraocular movements are normal.  Ears:  Tympanic  membranes are intact.  Nose:  There is no septal deviation.  Throat  without lesion.  NECK:  Supple without thyromegaly.  There is no supraclavicular or  axillary adenopathy.  CHEST:  Clear to auscultation and percussion.  HEART:  Regular sinus rhythm, no murmurs.  ABDOMEN:  Soft.  There is no hepatosplenomegaly.  Bowel sounds are  normal.  EXTREMITIES:  Pulses are 2+.  There is no clubbing or edema.  NEUROLOGIC:  She is oriented x3.  Sensory and motor are intact.  Cranial  nerves are intact.  SKIN:  Without lesions.   IMPRESSION:  1. Left upper lobe lesion, rule out cancer.  2. Chronic obstructive pulmonary disease.  3. Hypercholesterolemia.  4. Hypothyroidism.   PLAN:  1. Left VATS, wedge resection of the left upper lobe lesion with seed      implantation.  2. Smoking cessation.      Ines Bloomer, M.D.  Electronically Signed     DPB/MEDQ  D:  02/11/2007  T:  02/11/2007  Job:  [id number]

## 2011-04-14 NOTE — Discharge Summary (Signed)
Karen Strong, ORMISTON                 ACCOUNT NO.:  [id number]   MEDICAL RECORD NO.:  [id number]          PATIENT TYPE:  INP   LOCATION:  3301                         FACILITY:  [facility]   PHYSICIAN:  Ines Bloomer, M.D. DATE OF BIRTH:  Jan 24, 1941   DATE OF ADMISSION:  02/12/2007  DATE OF DISCHARGE:                               DISCHARGE SUMMARY   DATE OF DISCHARGE:  Tentative one to two days.   ADMISSION DIAGNOSIS:  Lung nodules.   DISCHARGE DIAGNOSES:  1. Nonsmall cell lung cancer.  2. Chronic obstructive pulmonary disease.  3. Hypercholesterolemia.  4. Hypothyroidism.  5. Arthritis.  6. Hypertension.   PROCEDURES:  On February 12, 2007, the patient underwent a left VATS, left  upper lobe wedge resection, with seated mesh implantation by Dr. Ines Bloomer.   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  She smokes up to two  packs a day.  She has pulmonary function tests showing an FVC of 1.46  and FEV 101.09.  Because of her cough, she had a chest x-ray and a CT  scan which showed an 11-mm nodule in the left upper lobe which was  thought to be cancer.  She underwent a PET scan which was positive with  an SUV of 2.4 and a left upper lobe questionable area of inflammation of  the left lingula and some patchy areas in the right upper lobe that is  also thought to be inflammatory.  She was referred to Dr. Kathrynn Running for  evaluation, is being admitted for wedge resection of the left lower lobe  with radioactive seed implantation.  The results of the presentation was  discussed with the patient.   HOSPITAL COURSE:  __________ and the patient has progressed quite well.  She underwent a left VATS with wedge resection of left lower lobe mass  and seated implantation by Dr. Edwyna Shell without any complications.   On postoperative day number one, the patient's chest x-ray was stable.  She did not have air leak in her  chest tube.   On postoperative day number two, her anterior chest tube was  discontinued.   On postoperative day number three, her second chest tube was  discontinued.  Followup chest x-rays have remained stable without  pneumothorax.  The patient is breathing without difficulty.  She is  currently 95% saturations on 1 liter nasal cannula.  The patient will  have a followup chest x-ray in the morning.   The patient's vital signs have remained stable, and she has remained  afebrile.  She has remained in sinus rhythm.  Urine output is good.  Renal function is BUN 6 and creatinine 0.57.  The patient is doing  incentive spirometry appropriately.  She is ambulating without  difficulty.   Hematology saw the patient on February 15, 2007.  She is stage 1A T1 N0 M0  non small cell lung cancer.  She will see Dr. Arbutus Ped in followup  appointment in about four to six weeks.  DISCHARGE DISPOSITION:  The patient will be discharged home in the next  one to two days provided her chest x-ray remains stable, she remains  hemodynamically stable, and her vital signs are stable.   MEDICATIONS:  1. Oxycodone 5 to 10 mg p.o. every four hours p.r.n.  2. Doxepin 1 to 2 tablets q.h.s. p.r.n.  3. Zetia 10 mg p.o. daily.  4. Os Cal __________ 80 mg p.o. daily.  5. Arthrotec 75 mg p.o. daily.  6. Synthroid 100 mcg p.o. daily.  7. Cozaar 100 mg per day.  8. Reglan 10 mg p.o. p.r.n.  9. __________  Regenia Skeeter 25/50 mg p.o. p.r.n.  10.Methocarbamol as needed.  11.Vitamin D as needed.   INSTRUCTIONS:  The patient is told to follow a low-fat, low-salt diet.  No driving or heaving lifting greater than 10 pounds for two weeks.  The  patient is to ambulate three times a day and increase activity as  tolerated.  She may shower, clean her incisions with mild soap and  water.  She is to call our office if any problems should arise such as  incision erythema, drainage, temperature greater than 101.5.   FOLLOWUP:  The  patient will follow up with Dr. Edwyna Shell in one week where  she will have a chest x-ray.  Office will contact her with time and date  of appointment.      Constance Holster, [place]      Ines Bloomer, M.D.  Electronically Signed    JMW/MEDQ  D:  02/15/2007  T:  02/15/2007  Job:  [id number]
